# Patient Record
Sex: Male | Born: 1990 | Race: Black or African American | Hispanic: No | Marital: Single | Smoking: Never smoker
Health system: Southern US, Community
[De-identification: ages and names within clinical notes are randomized; demographics above are authoritative.]

---
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
## 2010-02-02 ENCOUNTER — Encounter: Payer: Self-pay | Admitting: *Deleted

## 2010-04-12 ENCOUNTER — Encounter: Payer: Self-pay | Admitting: Family Medicine

## 2010-07-27 NOTE — Letter (Signed)
Summary: Generic Letter  [facility]  [address]   [place], [place] [address]   Phone: [phone]  Fax: [phone]    02/02/2010  Jerilynn Birkenhead [address] [place], [place]  [address]  Dear Mr. DEDMAN,   At your earliest conveinence please come by our office and sign a release so that we can retrieve your medical records.  Thank you in advance for your time and attention to this matter.  Also will you please verify your telephone number so that we can update our records.         Sincerely,   Loralee Pacas CMA

## 2010-07-27 NOTE — Miscellaneous (Signed)
Summary: Asthma, ntermittant  Clinical Lists Changes  Problems: Changed problem from ASTHMA, INTERMITTENT, MILD (ICD-493.90) to ASTHMA, INTERMITTENT (ICD-493.90)

## 2010-07-27 NOTE — Assessment & Plan Note (Signed)
Summary: hospital f/up,tcb   Vital Signs:  Patient profile:   20 year old male Weight:      168.6 pounds Temp:     98.1 degrees F oral Pulse rate:   69 / minute Pulse rhythm:   regular BP sitting:   120 / 72  (left arm) Cuff size:   regular  Vitals Entered By: Loralee Pacas CMA (October 05, 2009 1:36 PM) Comments sister of patient is here and is concerned with the paitent "blanking out"   Primary Care Provider:  Delbert Harness MD   History of Present Illness: 20 yo here for hospital follow-up.  Patient was admitted to [facility] on 10/01/09 for altered mental status in the setting of ecstacy use, marijuana use, stimulants.  He did not require any medical therapy and his mental status improved.  Concern duing hospitalization was brought up due to a report of homicidal ideation and mom's concern that there might be an underlying psychiatric disorder.  Patient left AMA from the hospital prior to psychiatric consult due to not wanting to wait any further.  Since that patient's discharge from the hospital yesterday, he denies any SI, HI, anxiety, depression, risky behavior.  His sister is here with him today and expressed concern that he is not his usual self.  He is alert and oriented, but not as talkative as usual.  Patient does not have much to add.   No psychiatric history.  Notes decreased sleeping but does appear to make up for this with daytime naps and somnolence.  No hallucinations, delusions.   Allergies: No Known Drug Allergies  Review of Systems      See HPI Neuro:  Denies memory loss, seizures, and visual disturbances. Psych:  Complains of mental problems; denies alternate hallucination ( auditory/visual), anxiety, depression, easily angered, easily tearful, irritability, panic attacks, sense of great danger, suicidal thoughts/plans, thoughts of violence, unusual visions or sounds, and thoughts /plans of harming others.  Physical Exam  General:  Well-developed,  well-nourished, no acute distress.  Alert and oriented x 3.  Well groomed.  Here with sister.  Eyes:  bilateral mild conjunctival injection,  PERRLA. Psych:  Fair eye contact.  Has poor insight into why he was admitted to hospital.  Somewhat flat affect but smiles at times.  Responds to questions appropriately but answers are brief.  Oriented X3, memory intact for recent and remote, not anxious appearing, and not depressed appearing.     Impression & Recommendations:  Problem # 1:  ALTERED MENTAL STATUS (ICD-780.97)  question of continued substance abuse vs underlying psychiatric disorder.  No evidence of hallucinations, SI, HI- unclear evidence for psychiatric disorder at this time.  Will refer to psychiatry today for further evaluation.  Patient is agreeable to evaluation.  Follow-up in 1-2 weeks for re-eval to see if patient's behaviro is at baseline.  I do not feel patient is a threat to himself or others at this time.  Given patient and family red flags to emergency evaluation.  Orders: [facility]- Est Level  3 (04540) Psychiatric Referral (Psych)  Problem # 2:  ALTERED MENTAL STATUS (ICD-780.97)  Orders: [facility]- Est Level  3 (98119) Psychiatric Referral (Psych)  Patient Instructions: 1)  Avoid any drug use. 2)  If you or family notices risky behavior, suicidal or homicidal thoughts, please seek emergency care immediately. 3)  We will start the referral process to get you into psychiatric care.  If you do not hear about an appointment time within the next week,  please contact us. 4)  Follow-up 1-2 weeks with Dr. Earnest Bailey or Dr. Benjamin Stain.

## 2010-07-27 NOTE — Miscellaneous (Signed)
Summary: re: psychiatrist referral/TS  Clinical Lists Changes 1.) called the [facility]. Pt needs to call the [facility] and sched. the appt. They will evaluate the pt. 2.) called the pt and advised to call [facility] for an appt. Pt said, that he is doing fine. I told the pt, that Dr.Briscoe wanted him to see a Psychiatrist and to sched. an appt. He agreed. I told the pt, that once he has an appt with them, that he has to sign a ROI in order for [place] to fax his information to the [facility]. Pt agreed. fwd. to Dr.Briscoe for review.Arlyss Repress CMA,  October 18, 2009 4:32 PM

## 2010-07-31 IMAGING — CR DG ABDOMEN 2V
1 series · 1 of 1 positions shown · non-contrast
Comparison: None

CLINICAL DATA: Abdominal pain, vomiting.

ABDOMEN - 2 VIEW

[view not recorded]
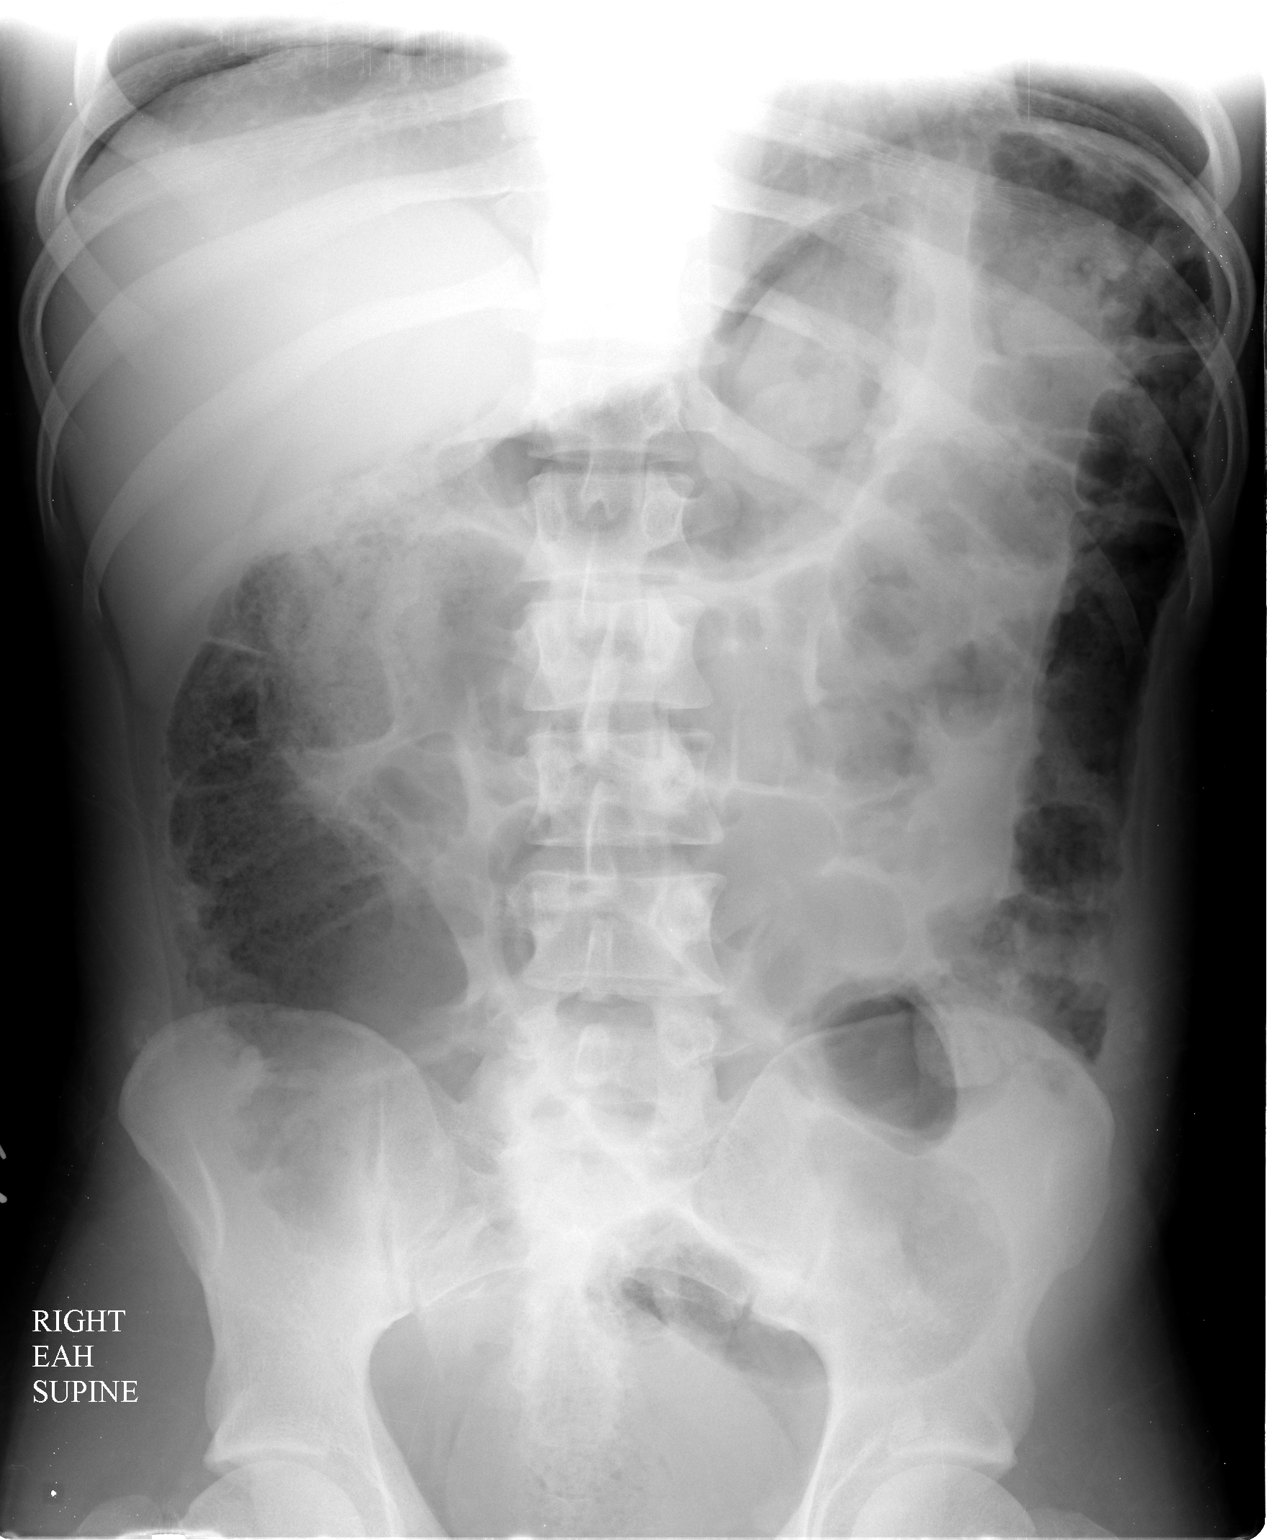

[1 of 1 positions shown; findings below may reference images not displayed]

FINDINGS: Moderate gaseous distention of bowel, predominately
colon, question ileus.  Large amount of stool throughout the colon.
No definite obstruction.  No free air, organomegaly or suspicious
calcification.
IMPRESSION: Question ileus.  Large stool burden throughout the colon.

## 2010-09-13 LAB — GLUCOSE, CAPILLARY
Glucose-Capillary: 104 mg/dL — ABNORMAL HIGH (ref 70–99)
Glucose-Capillary: 106 mg/dL — ABNORMAL HIGH (ref 70–99)
Glucose-Capillary: 123 mg/dL — ABNORMAL HIGH (ref 70–99)
Glucose-Capillary: 129 mg/dL — ABNORMAL HIGH (ref 70–99)
Glucose-Capillary: 175 mg/dL — ABNORMAL HIGH (ref 70–99)
Glucose-Capillary: 89 mg/dL (ref 70–99)
Glucose-Capillary: 98 mg/dL (ref 70–99)

## 2010-09-13 LAB — BASIC METABOLIC PANEL
BUN: 11 mg/dL (ref 6–23)
Calcium: 8.5 mg/dL (ref 8.4–10.5)
Calcium: 9.1 mg/dL (ref 8.4–10.5)
GFR calc Af Amer: >60 mL/min (ref >60)
GFR calc non Af Amer: >60 mL/min (ref >60)
Glucose, Bld: 235 mg/dL — ABNORMAL HIGH (ref 70–99)

## 2010-09-13 LAB — URINALYSIS, ROUTINE W REFLEX MICROSCOPIC
Bilirubin Urine: NEGATIVE
Glucose, UA: >1000 mg/dL — AB
Ketones, ur: NEGATIVE mg/dL
Leukocytes, UA: NEGATIVE
Nitrite: NEGATIVE
Specific Gravity, Urine: 1.021 (ref 1.005–1.030)
Urobilinogen, UA: 0.2 mg/dL (ref 0.0–1.0)

## 2010-09-13 LAB — CBC
HCT: 43.2 % (ref 39.0–52.0)
MCHC: 34.2 g/dL (ref 30.0–36.0)
RBC: 4.51 MIL/uL (ref 4.22–5.81)
RDW: 13.2 % (ref 11.5–15.5)
WBC: 8.6 10*3/uL (ref 4.0–10.5)

## 2010-09-13 LAB — HEMOGLOBIN A1C
Hgb A1c MFr Bld: 5.6 % (ref 4.6–6.1)
Mean Plasma Glucose: 114 mg/dL

## 2010-09-13 LAB — RAPID URINE DRUG SCREEN, HOSP PERFORMED
Amphetamines: NOT DETECTED
Benzodiazepines: NOT DETECTED
Cocaine: NOT DETECTED
Tetrahydrocannabinol: POSITIVE — AB

## 2011-05-01 ENCOUNTER — Encounter: Payer: Self-pay | Admitting: Family Medicine

## 2011-05-01 ENCOUNTER — Ambulatory Visit (INDEPENDENT_AMBULATORY_CARE_PROVIDER_SITE_OTHER): Payer: BC Managed Care – PPO | Admitting: Family Medicine

## 2011-05-01 VITALS — BP 124/66 | HR 64 | Temp 99.4°F | Ht 66.25 in | Wt 158.8 lb

## 2011-05-01 DIAGNOSIS — Z0000 Encounter for general adult medical examination without abnormal findings: Secondary | ICD-10-CM

## 2011-05-13 ENCOUNTER — Encounter: Payer: Self-pay | Admitting: Family Medicine

## 2011-05-13 NOTE — Progress Notes (Signed)
Subjective: Pt presents today for physical exam and for paperwork related to entry into the [employer].  He reports that he has no medical problems (having grown out of his asthma) and since his hopsitalizaiton several years ago for drug-associated AMS he has not been using any illicit substances.  He denies any current alcohol or tobacco.  ROS: 12 point ROS was performed and was unremarkable.  Pt does have some current nasal congestion related to a cold he is getting over but otherwise has a negative ROS.  Particularly, no CP/SOB/DOE, problems with urinary incontinency, blood per rectum, or lightheadedness/fainting.  FHx, SHx, and PMHx were reviewed.  Objective:  Filed Vitals:   05/01/11 1437  BP: 124/66  Pulse: 64  Temp: 99.4 F (37.4 C)   Gen: NAD, well developed HEENT: PERRL, EOMI, trachea midline, no adenopathy, no JVD CV: RRR, no MRG Resp: CTABL, no crackles, wheezes, or focal findings Abd: SNTND, no masses/organomegaly Ext: 2+ pulses, <2 sec cap refill Neuro: Alert and oriented x3, reflexes symmetric and appropriate, strength 5+ bilaterally.  CN II-XII intact.  Assessment/Plan: Normal physical exam.  No concerning findings.  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  Please also see individual problems in problem list for problem-specific plans.

## 2014-07-10 IMAGING — CR DG CHEST 1V
1 series · 1 of 1 positions shown · non-contrast
Comparison: DG CHEST 2 VIEW dated 04/17/2005

CLINICAL DATA: Pre-employment.

EXAM:
CHEST - 1 VIEW

[PA]
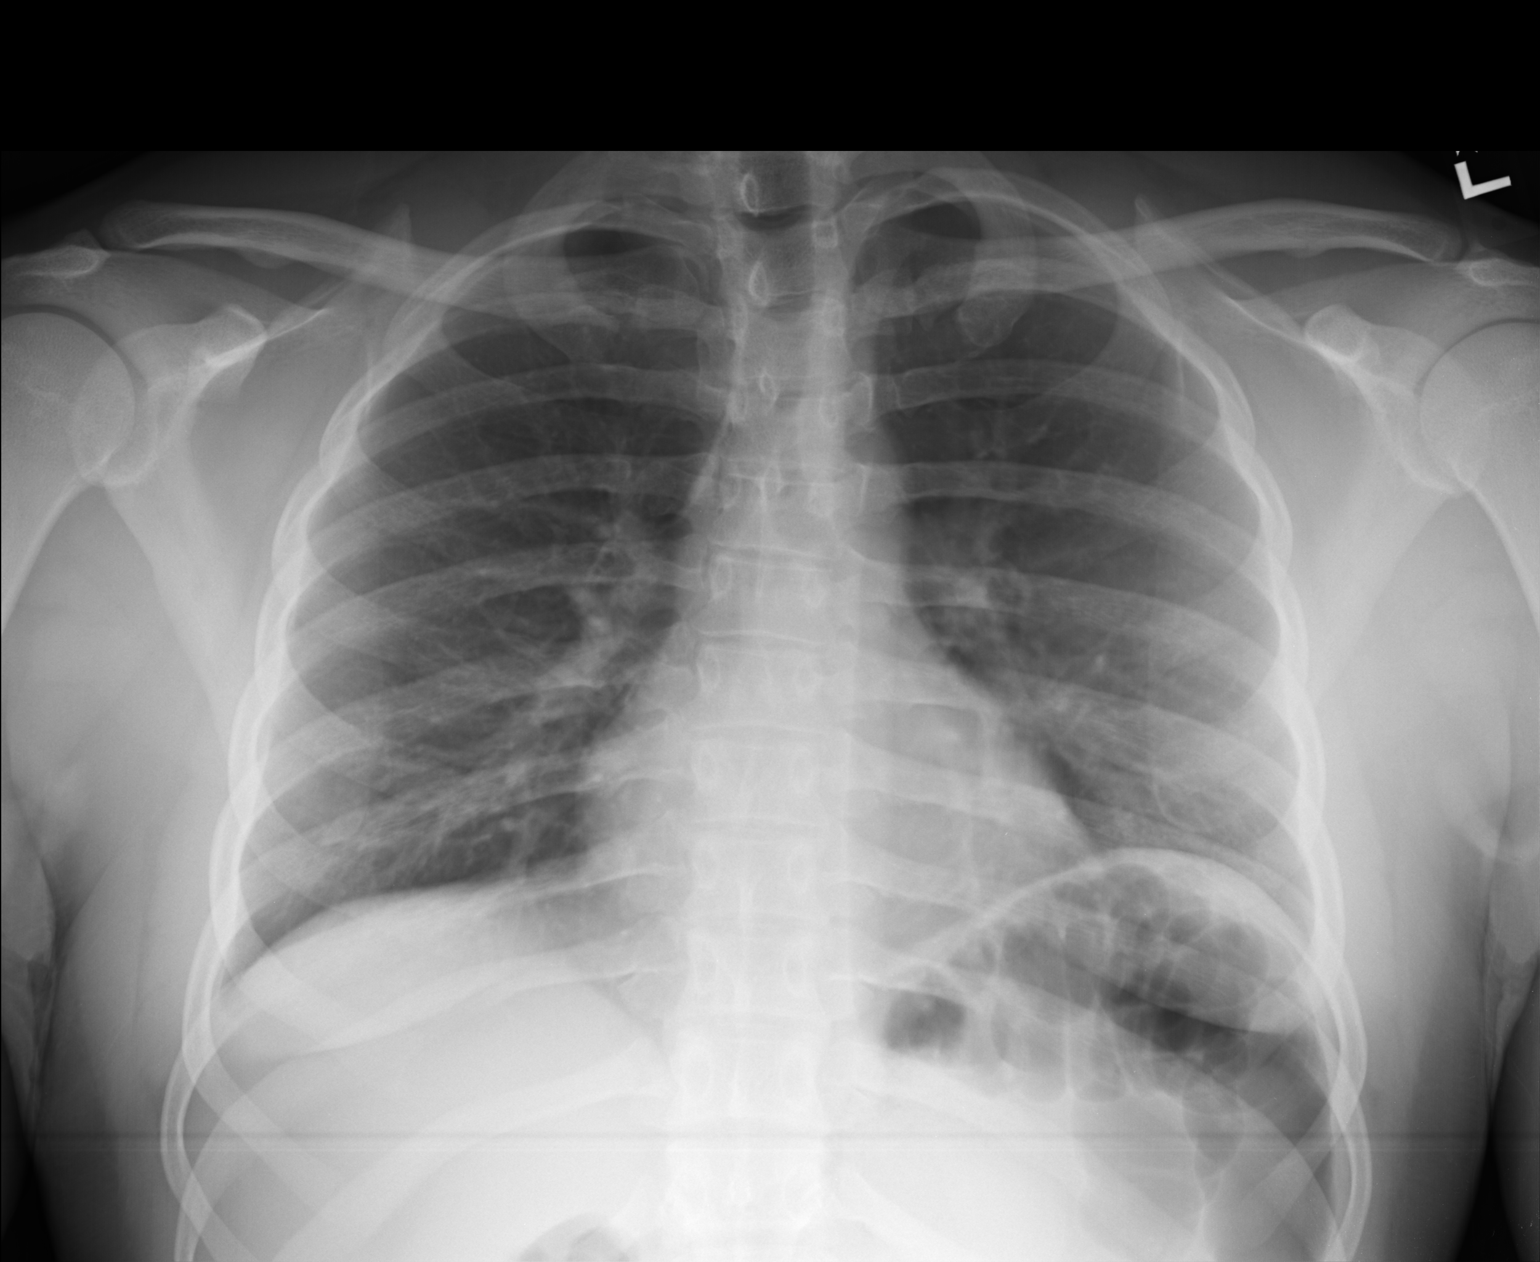

[1 of 1 positions shown; findings below may reference images not displayed]

FINDINGS: The cardiomediastinal silhouette is within normal limits. Minimal
elevation of left hemidiaphragm is unchanged. The patient has taken
a shallower inspiration than on the prior study. The lungs are
clear. No pleural effusion or pneumothorax is identified. No acute
osseous abnormality is seen.
IMPRESSION: No active disease.

## 2022-08-28 ENCOUNTER — Telehealth: Payer: Self-pay

## 2022-08-28 NOTE — Telephone Encounter (Signed)
Mychart msg sent
